# Patient Record
Sex: Male | Born: 2000 | Race: White | Hispanic: No | Marital: Single | Smoking: Never smoker
Health system: Southern US, Community
[De-identification: ages and names within clinical notes are randomized; demographics above are authoritative.]

## PROBLEM LIST (undated history)

## (undated) DIAGNOSIS — F988 Other specified behavioral and emotional disorders with onset usually occurring in childhood and adolescence: Secondary | ICD-10-CM

## (undated) DIAGNOSIS — G479 Sleep disorder, unspecified: Secondary | ICD-10-CM

## (undated) HISTORY — PX: CIRCUMCISION: SUR203

## (undated) HISTORY — DX: Other specified behavioral and emotional disorders with onset usually occurring in childhood and adolescence: F98.8

## (undated) HISTORY — DX: Sleep disorder, unspecified: G47.9

## (undated) HISTORY — PX: DENTAL SURGERY: SHX609

---
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
## 2020-03-09 ENCOUNTER — Emergency Department (HOSPITAL_COMMUNITY): Payer: 59

## 2020-03-09 ENCOUNTER — Emergency Department (HOSPITAL_COMMUNITY)
Admission: EM | Admit: 2020-03-09 | Discharge: 2020-03-09 | Disposition: A | Discharge status: Home / Self Care | Payer: 59 | Attending: Emergency Medicine | Admitting: Emergency Medicine

## 2020-03-09 DIAGNOSIS — S71132A Puncture wound without foreign body, left thigh, initial encounter: Secondary | ICD-10-CM | POA: Diagnosis present

## 2020-03-09 DIAGNOSIS — W320XXA Accidental handgun discharge, initial encounter: Secondary | ICD-10-CM | POA: Insufficient documentation

## 2020-03-09 MED ORDER — OXYCODONE HCL 5 MG PO TABS
5.0000 mg | ORAL_TABLET | Freq: Once | ORAL | Status: AC
Start: 2020-03-09 — End: 2020-03-09
  Administered 2020-03-09: 01:00:00 5 mg via ORAL
  Filled 2020-03-09: qty 1

## 2020-03-09 NOTE — ED Notes (Signed)
Patient back from  X-ray 
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
## 2020-03-09 NOTE — ED Notes (Signed)
Patient transported to X-ray 

## 2020-03-09 NOTE — Discharge Instructions (Addendum)
You were evaluated in the Emergency Department and after careful evaluation, we did not find any emergent condition requiring admission or further testing in the hospital.  Your exam/testing today was overall reassuring.  X-rays did not show any broken bones.  No signs of significant injury.  You should heal well but will likely experience soreness for the next few weeks.  Use crutches as needed for comfort.  Use Tylenol and/or Motrin for pain.  Please return to the Emergency Department if you experience any worsening of your condition.  Thank you for allowing [place] to be a part of your care.
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
## 2021-02-09 ENCOUNTER — Encounter: Payer: Self-pay | Admitting: Emergency Medicine

## 2021-02-09 ENCOUNTER — Ambulatory Visit
Admission: EM | Admit: 2021-02-09 | Discharge: 2021-02-09 | Disposition: A | Discharge status: Home / Self Care | Payer: 59 | Attending: Family Medicine | Admitting: Family Medicine

## 2021-02-09 DIAGNOSIS — B349 Viral infection, unspecified: Secondary | ICD-10-CM | POA: Diagnosis not present

## 2021-02-09 MED ORDER — PREDNISONE 10 MG PO TABS: 10.0000 mg | ORAL_TABLET | Freq: Every day | ORAL | 0 refills | Status: AC

## 2021-02-09 MED ORDER — PROMETHAZINE-DM 6.25-15 MG/5ML PO SYRP: 5.0000 mL | ORAL_SOLUTION | Freq: Three times a day (TID) | ORAL | 0 refills | Status: AC | PRN

## 2021-02-09 NOTE — ED Triage Notes (Signed)
Pt here with headache, sore throat, nasal congestion x 4 days.

## 2021-02-09 NOTE — ED Provider Notes (Signed)
Randy Hooper    CSN: [id number] Arrival date & time: 02/09/21  1303      History   Chief Complaint Chief Complaint  Patient presents with   Nasal Congestion   Cough   Headache   Sore Throat    HPI Randy Hooper is a 20 y.o. male.   HPI Patient presents today for cough, nasal congestion, headache and sore throat x4 days.  Patient is unsure if he has had a fever or not.  He has been taking over-the-counter medication including Tylenol.  No known exposure to anyone has been sick with COVID or Flu.  .  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 Authorizing Provider  predniSONE (DELTASONE) 10 MG tablet Take 1 tablet (10 mg total) by mouth daily with breakfast for 5 days. 02/09/21 02/14/21 Yes Bing Neighbors, FNP  promethazine-dextromethorphan (PROMETHAZINE-DM) 6.25-15 MG/5ML syrup Take 5 mLs by mouth 3 (three) times daily as needed for cough. 02/09/21  Yes Bing Neighbors, FNP  modafinil (PROVIGIL) 200 MG tablet Take 1 tablet (200 mg total) by mouth daily. 08/26/16   Dohmeier, Porfirio Mylar, MD  venlafaxine XR (EFFEXOR-XR) 75 MG 24 hr capsule Take by mouth. 12/14/17 12/14/18  [provider]    Family History Family History  Problem Relation Age of Onset   Migraines Mother    GER disease Mother    Hypertension Maternal Grandmother    Aneurysm Maternal Grandmother     Heart attack Maternal Grandfather    Hypertension Maternal Grandfather    Heart disease Maternal Grandfather    Stroke Paternal Grandfather    Breast cancer Maternal Aunt    Skin cancer Paternal Aunt     Social History Social History   Tobacco Use   Smoking status: Never   Smokeless tobacco: Never  Substance Use Topics   Alcohol use: No    Alcohol/week: 0.0 standard drinks   Drug use: No     Allergies   Patient has no known allergies.   Review of Systems Review of Systems Pertinent negatives listed in HPI  Physical Exam Triage Vital Signs ED Triage Vitals  Enc Vitals Group     BP 02/09/21 1442 115/72     Pulse Rate 02/09/21 1442 98     Resp 02/09/21 1442 20     Temp 02/09/21 1442 99.1 F (37.3 C)     Temp src --      SpO2 02/09/21 1442 98 %     Weight --      Height --      Head Circumference --      Peak Flow --      Pain Score 02/09/21 1446 3     Pain Loc --      Pain Edu? --      Excl. in [place]? --  No data found.  Updated Vital Signs BP 115/72   Pulse 98   Temp 99.1 F (37.3 C)   Resp 20   SpO2 98%   Visual Acuity Right Eye Distance:   Left Eye Distance:   Bilateral Distance:    Right Eye Near:   Left Eye Near:    Bilateral Near:     Physical Exam  General Appearance:    Alert, cooperative, no distress  HENT:   Normocephalic, ears normal, nares mucosal edema with congestion, rhinorrhea, oropharynx clear   Eyes:    PERRL, conjunctiva/corneas clear, EOM's intact       Lungs:     Clear to auscultation bilaterally, respirations unlabored  Heart:    Regular rate and rhythm  Neurologic:   Awake, alert, oriented x 3. No apparent focal neurological           defect.      UC Treatments / Results  Labs (all labs ordered are listed, but only abnormal results are displayed) Labs Reviewed  COVID-19, FLU A+B NAA    EKG   Radiology No results found.  Procedures Procedures (including critical care time)  Medications Ordered in  UC Medications - No data to display  Initial Impression / Assessment and Plan / UC Course  I have reviewed the triage vital signs and the nursing notes.  Pertinent labs & imaging results that were available during my care of the patient were reviewed by me and considered in my medical decision making (see chart for details).    Viral illness, Respiratory panel pending. Treating symptoms as patient is outside of the window of antiviral therapy to be effective.  Continue Tylenol or ibuprofen for body aches.  Return as needed Final Clinical Impressions(s) / UC Diagnoses   Final diagnoses:  Viral illness   Discharge Instructions   None    ED Prescriptions     Medication Sig Dispense Auth. Provider   promethazine-dextromethorphan (PROMETHAZINE-DM) 6.25-15 MG/5ML syrup Take 5 mLs by mouth 3 (three) times daily as needed for cough. 140 mL Bing Neighbors, FNP   predniSONE (DELTASONE) 10 MG tablet Take 1 tablet (10 mg total) by mouth daily with breakfast for 5 days. 5 tablet Bing Neighbors, FNP      PDMP not reviewed this encounter.   Bing Neighbors, FNP 02/09/21 1511

## 2021-02-10 LAB — COVID-19, FLU A+B NAA
Influenza A, NAA: DETECTED — AB
Influenza B, NAA: NOT DETECTED
SARS-CoV-2, NAA: NOT DETECTED

## 2021-04-10 ENCOUNTER — Other Ambulatory Visit: Payer: Self-pay

## 2021-04-10 ENCOUNTER — Emergency Department
Admission: EM | Admit: 2021-04-10 | Discharge: 2021-04-10 | Disposition: A | Discharge status: Home / Self Care | Payer: 59 | Attending: Emergency Medicine | Admitting: Emergency Medicine

## 2021-04-10 DIAGNOSIS — Z1330 Encounter for screening examination for mental health and behavioral disorders, unspecified: Secondary | ICD-10-CM | POA: Insufficient documentation

## 2021-04-10 DIAGNOSIS — Z5321 Procedure and treatment not carried out due to patient leaving prior to being seen by health care provider: Secondary | ICD-10-CM | POA: Insufficient documentation

## 2021-04-10 LAB — COMPREHENSIVE METABOLIC PANEL
ALT: 17 U/L (ref 0–44)
AST: 25 U/L (ref 15–41)
Albumin: 4.6 g/dL (ref 3.5–5.0)
Alkaline Phosphatase: 67 U/L (ref 38–126)
Anion gap: 8 (ref 5–15)
BUN: 15 mg/dL (ref 6–20)
CO2: 29 mmol/L (ref 22–32)
Calcium: 9.9 mg/dL (ref 8.9–10.3)
Chloride: 102 mmol/L (ref 98–111)
Creatinine, Ser: 0.79 mg/dL (ref 0.61–1.24)
GFR, Estimated: >60 mL/min (ref >60)
Glucose, Bld: 106 mg/dL — ABNORMAL HIGH (ref 70–99)
Potassium: 3.9 mmol/L (ref 3.5–5.1)
Sodium: 139 mmol/L (ref 135–145)
Total Bilirubin: 0.4 mg/dL (ref 0.3–1.2)
Total Protein: 7.7 g/dL (ref 6.5–8.1)

## 2021-04-10 LAB — CBC
HCT: 44.7 % (ref 39.0–52.0)
Hemoglobin: 15.2 g/dL (ref 13.0–17.0)
MCH: 29.5 pg (ref 26.0–34.0)
MCHC: 34 g/dL (ref 30.0–36.0)
MCV: 86.8 fL (ref 80.0–100.0)
Platelets: 279 10*3/uL (ref 150–400)
RBC: 5.15 MIL/uL (ref 4.22–5.81)
RDW: 11.4 % — ABNORMAL LOW (ref 11.5–15.5)
WBC: 8.1 10*3/uL (ref 4.0–10.5)
nRBC: 0 % (ref 0.0–0.2)

## 2021-04-10 LAB — SALICYLATE LEVEL: Salicylate Lvl: <7 mg/dL — ABNORMAL LOW (ref 7.0–30.0)

## 2021-04-10 LAB — ACETAMINOPHEN LEVEL: Acetaminophen (Tylenol), Serum: <10 ug/mL — ABNORMAL LOW (ref 10–30)

## 2021-04-10 LAB — ETHANOL: Alcohol, Ethyl (B): <10 mg/dL (ref <10)

## 2021-04-10 NOTE — ED Notes (Signed)
The following belongings placed in a labeled bag: cell phone, hair tie, black t shirt, jeans, underwear, necklace, hat. Pt is opting to have brother take belongings.

## 2021-04-10 NOTE — ED Triage Notes (Signed)
Pt states is here "for a breakdown" pt denies Si or HI. Pt states has been feeling this way for months. Pt appears in no acute distress.

## 2021-04-10 NOTE — ED Notes (Signed)
Pt here with brother, states he would like to leave, pt's brother states he will take pt to [facility], pt denies any SI/HI. Ambulatory without difficulty.
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
